# Patient Record
Sex: Female | Born: 1970 | Race: White | Hispanic: No | Smoking: Never smoker
Health system: Southern US, Community
[De-identification: ages and names within clinical notes are randomized; demographics above are authoritative.]

## PROBLEM LIST (undated history)

## (undated) DIAGNOSIS — T753XXA Motion sickness, initial encounter: Secondary | ICD-10-CM

## (undated) HISTORY — PX: WISDOM TOOTH EXTRACTION: SHX21

## (undated) HISTORY — PX: TONSILLECTOMY: SUR1361

## (undated) HISTORY — PX: HERNIA REPAIR: SHX51

## (undated) HISTORY — PX: ANKLE SURGERY: SHX546

---

## 2006-02-03 ENCOUNTER — Ambulatory Visit: Payer: Self-pay

## 2006-02-10 ENCOUNTER — Ambulatory Visit: Payer: Self-pay

## 2007-02-06 ENCOUNTER — Ambulatory Visit: Payer: Self-pay

## 2008-02-06 ENCOUNTER — Ambulatory Visit: Payer: Self-pay

## 2008-11-03 ENCOUNTER — Inpatient Hospital Stay: Payer: Self-pay | Admitting: Specialist

## 2014-03-26 ENCOUNTER — Ambulatory Visit: Payer: Self-pay | Admitting: Obstetrics and Gynecology

## 2014-03-26 LAB — CBC
HCT: 37.3 % (ref 35.0–47.0)
HGB: 11.7 g/dL — AB (ref 12.0–16.0)
MCH: 24 pg — ABNORMAL LOW (ref 26.0–34.0)
MCHC: 31.4 g/dL — AB (ref 32.0–36.0)
MCV: 77 fL — ABNORMAL LOW (ref 80–100)
Platelet: 237 10*3/uL (ref 150–440)
RBC: 4.87 10*6/uL (ref 3.80–5.20)
RDW: 16.5 % — ABNORMAL HIGH (ref 11.5–14.5)
WBC: 9.1 10*3/uL (ref 3.6–11.0)

## 2014-04-02 ENCOUNTER — Ambulatory Visit: Payer: Self-pay | Admitting: Obstetrics and Gynecology

## 2014-10-05 NOTE — Op Note (Signed)
PATIENT NAME:  Miranda Richardson, [name] MR#:  [id number] DATE OF BIRTH:  Nov 10, 1970  DATE OF PROCEDURE:  04/02/2014  PREOPERATIVE DIAGNOSIS:  Menorrhagia and severe dysmenorrhea.   POSTOPERATIVE DIAGNOSES: Menorrhagia and severe dysmenorrhea including endometrial polyp.   OPERATION PERFORMED: Hysteroscopy, dilatation and curettage, and NovaSure endometrial ablation.   ANESTHESIA:  General.   PRIMARY SURGEON: Lorrene ReidAndreas M Kaelani Kendrick, MD   PREOPERATIVE ANTIBIOTICS: None.   DRAINS OR TUBES: None.   IMPLANTS: None.   ESTIMATED BLOOD LOSS: Minimal.   OPERATIVE FLUIDS: 600 mL.   URINE OUTPUT: 150 mL.   COMPLICATIONS: None.   FINDINGS: Normal cervix. The uterine cavity was normal in shape, normal bilateral tubal ostia, small endometrial polyp removed pre-ablation via sharp curettage. Uterine sounding length was 11 cm, cervical length was 5.5 cm for a cavity length of 5.5 cm, cavity width was 4.7 cm. The burn time on the ablation was 1 minute, 19 seconds.   SPECIMENS REMOVED: Endometrial curettings.   THE PATIENT CONDITION FOLLOWING PROCEDURE: Stable.   PROCEDURE IN DETAIL: Risks, benefits, and alternatives of the procedure were discussed with the patient prior to proceeding to the operating room. The patient was taken to the operating room where she was placed under general endotracheal anesthesia. She was positioned in the dorsal lithotomy position utilizing Allen stirrups, prepped and draped in the usual sterile fashion. A timeout was performed. Attention was then turned to the patient's pelvis. The bladder was straight catheterized with a red rubber catheter, sterile speculum was placed. The anterior lip of the cervix was visualized, grasped with a single-tooth tenaculum and cervix was sequentially dilated using Pratt dilators. Hysteroscopy was then performed noting the above findings. The uterine sounding length prior to hysteroscopy was noted to be 11 cm, cervical length was actually obtained by  measuring using the hysteroscope and was noted to be 5.5 cm at the level of the internal cervical os. After the initial hysteroscopy, a sharp curettage was performed to remove the anterior endometrial polyp.  A second look hysteroscopy revealed the polyp was successfully removed.   At this point, the NovaSure device was introduced into the uterine cavity, and the device was deployed for a cavity width of 4.7 cm. The device passed cavity assessment and was activated for a burn time of 1 minute and 19 seconds. Following this, the NovaSure device was removed and a hysteroscopy revealed good coverage of the entire cavity with sparing of the endocervix. The single-tooth tenaculum was removed as was the operative speculum. Sponge, needle and instrument counts were correct x2. The patient tolerated the procedure well and was taken to the recovery room in stable condition.    ____________________________ Florina OuAndreas M. Bonney AidStaebler, MD ams:jp D: 04/05/2014 12:43:18 ET T: 04/05/2014 15:07:57 ET JOB#: [id number]  cc: Florina OuAndreas M. Bonney AidStaebler, MD, <Dictator> Carmel SacramentoANDREAS Cathrine MusterM Janalyn Higby MD ELECTRONICALLY SIGNED 04/11/2014 13:32

## 2014-10-07 LAB — SURGICAL PATHOLOGY

## 2015-12-25 ENCOUNTER — Other Ambulatory Visit: Payer: Self-pay | Admitting: Family Medicine

## 2015-12-25 DIAGNOSIS — Z1231 Encounter for screening mammogram for malignant neoplasm of breast: Secondary | ICD-10-CM

## 2016-01-08 ENCOUNTER — Other Ambulatory Visit: Payer: Self-pay | Admitting: Family Medicine

## 2016-01-08 ENCOUNTER — Ambulatory Visit
Admission: RE | Admit: 2016-01-08 | Discharge: 2016-01-08 | Disposition: A | Discharge status: Home / Self Care | Payer: BLUE CROSS/BLUE SHIELD | Source: Ambulatory Visit | Attending: Family Medicine | Admitting: Family Medicine

## 2016-01-08 DIAGNOSIS — Z1231 Encounter for screening mammogram for malignant neoplasm of breast: Secondary | ICD-10-CM | POA: Diagnosis not present

## 2017-06-29 ENCOUNTER — Other Ambulatory Visit: Payer: Self-pay | Admitting: Family Medicine

## 2017-06-29 DIAGNOSIS — Z01419 Encounter for gynecological examination (general) (routine) without abnormal findings: Secondary | ICD-10-CM

## 2017-06-29 DIAGNOSIS — Z1231 Encounter for screening mammogram for malignant neoplasm of breast: Secondary | ICD-10-CM

## 2017-08-10 ENCOUNTER — Ambulatory Visit
Admission: RE | Admit: 2017-08-10 | Discharge: 2017-08-10 | Disposition: A | Discharge status: Home / Self Care | Payer: BLUE CROSS/BLUE SHIELD | Source: Ambulatory Visit | Attending: Family Medicine | Admitting: Family Medicine

## 2017-08-10 ENCOUNTER — Other Ambulatory Visit: Payer: Self-pay | Admitting: Family Medicine

## 2017-08-10 DIAGNOSIS — Z01419 Encounter for gynecological examination (general) (routine) without abnormal findings: Secondary | ICD-10-CM

## 2017-08-10 DIAGNOSIS — Z1231 Encounter for screening mammogram for malignant neoplasm of breast: Secondary | ICD-10-CM | POA: Diagnosis present

## 2019-09-26 ENCOUNTER — Other Ambulatory Visit: Payer: Self-pay | Admitting: Family Medicine

## 2019-09-26 DIAGNOSIS — Z1231 Encounter for screening mammogram for malignant neoplasm of breast: Secondary | ICD-10-CM

## 2020-01-08 ENCOUNTER — Ambulatory Visit
Admission: RE | Admit: 2020-01-08 | Discharge: 2020-01-08 | Disposition: A | Discharge status: Home / Self Care | Payer: BC Managed Care – PPO | Source: Ambulatory Visit | Attending: Family Medicine | Admitting: Family Medicine

## 2020-01-08 ENCOUNTER — Other Ambulatory Visit: Payer: Self-pay

## 2020-01-08 DIAGNOSIS — Z1231 Encounter for screening mammogram for malignant neoplasm of breast: Secondary | ICD-10-CM | POA: Insufficient documentation

## 2020-11-25 IMAGING — MG DIGITAL SCREENING BILAT W/ TOMO W/ CAD
8 series · 8 of 24 positions shown · non-contrast
Comparison: Previous exam(s).

CLINICAL DATA: Screening.

EXAM:
DIGITAL SCREENING BILATERAL MAMMOGRAM WITH TOMO AND CAD

[R MLO synth-2D]
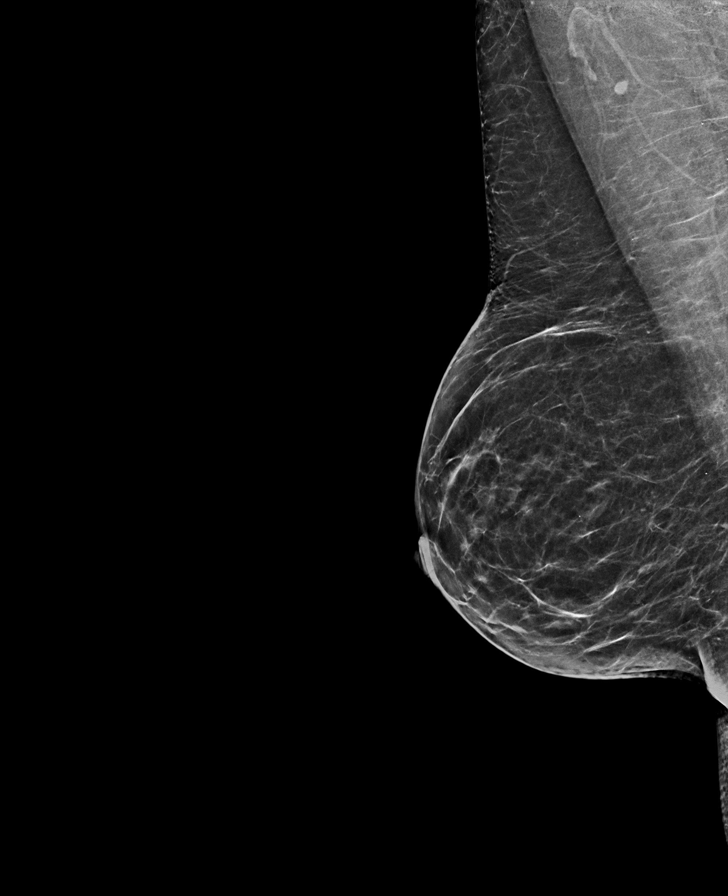

[L MLO synth-2D]
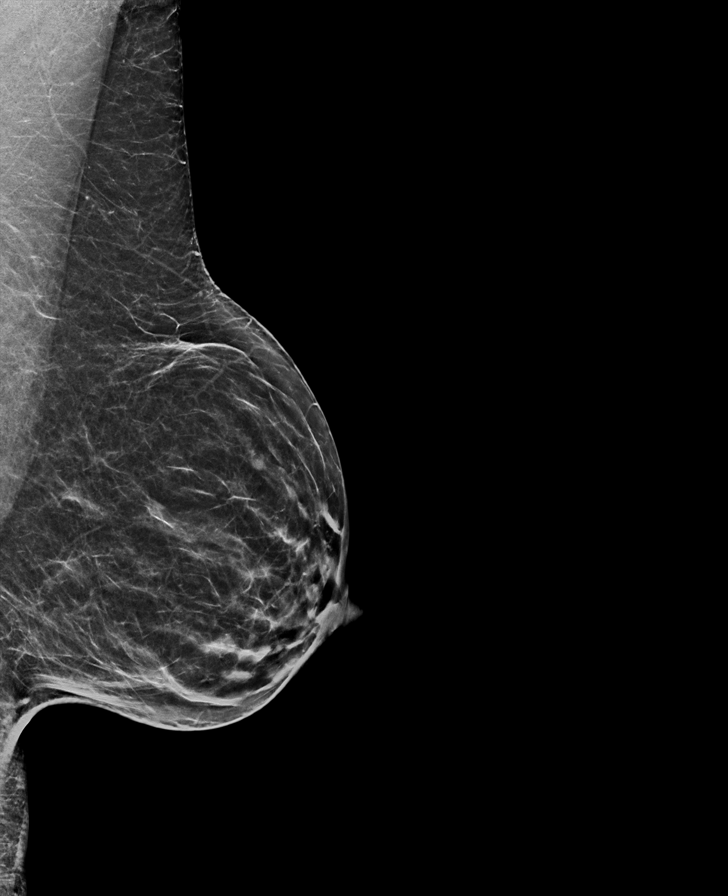

[L CC synth-2D]
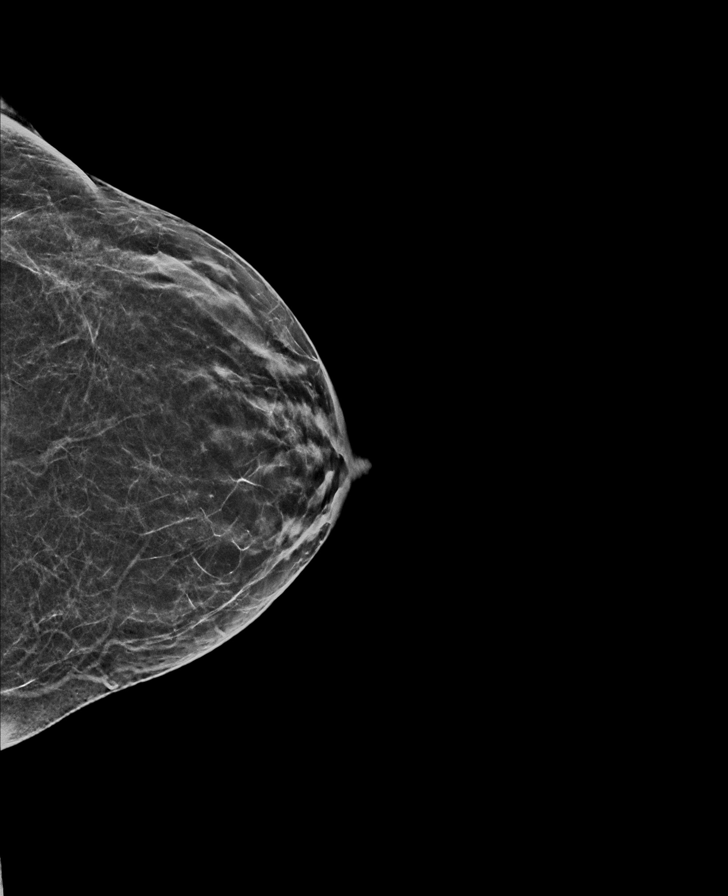

[R CC synth-2D]
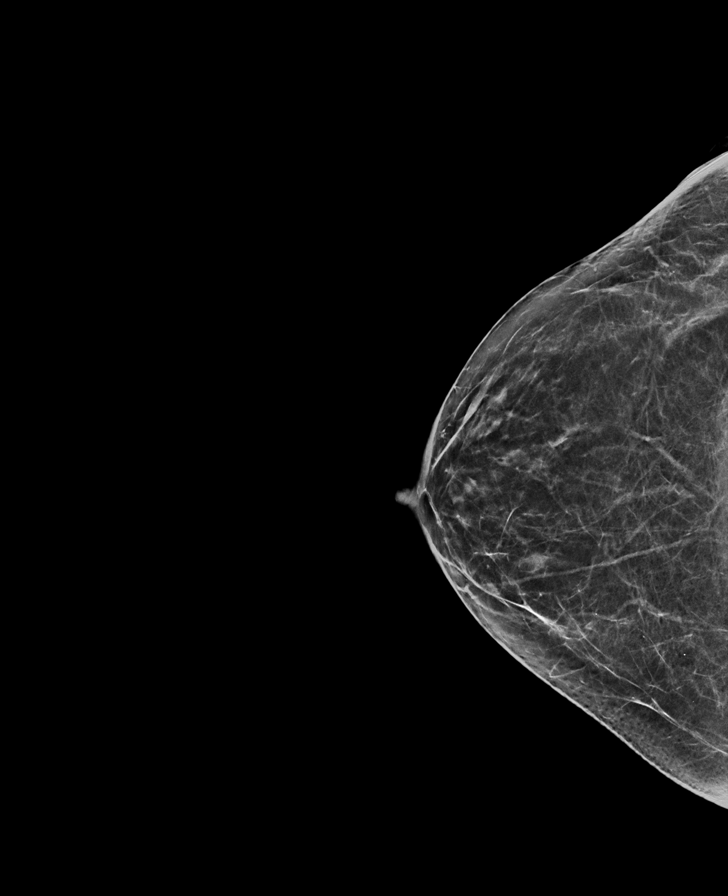

[L MLO tomo · tomo slice 31/60.0]
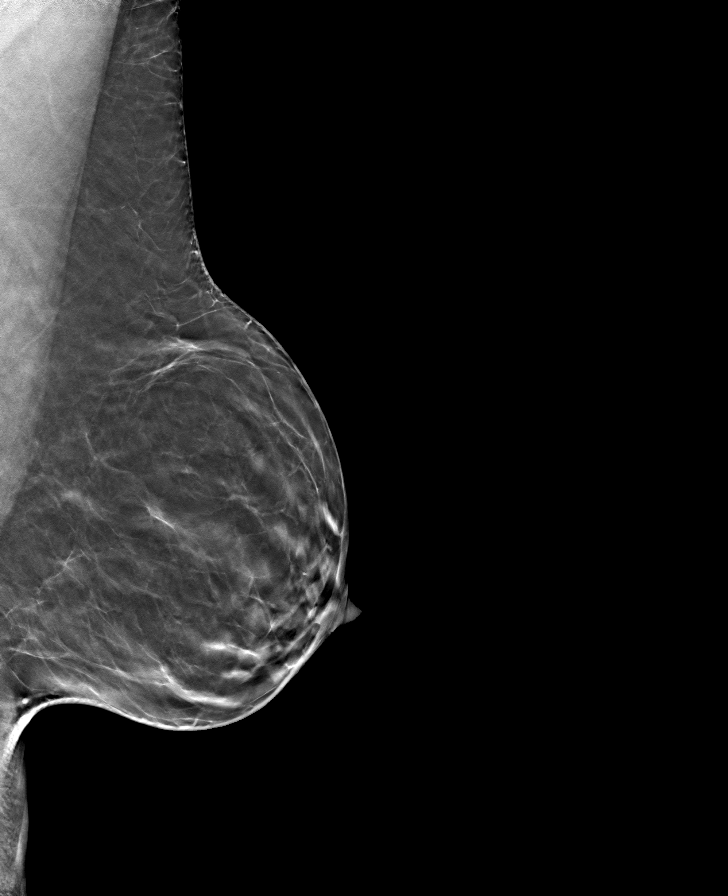

[L CC tomo · tomo slice 27/53.0]
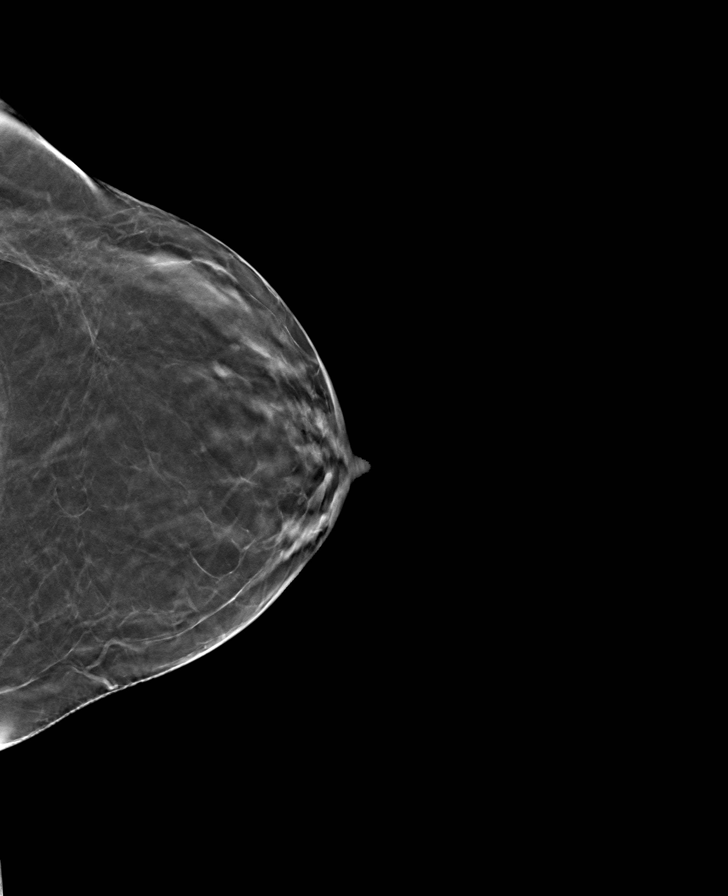

[R MLO tomo · tomo slice 31/61.0]
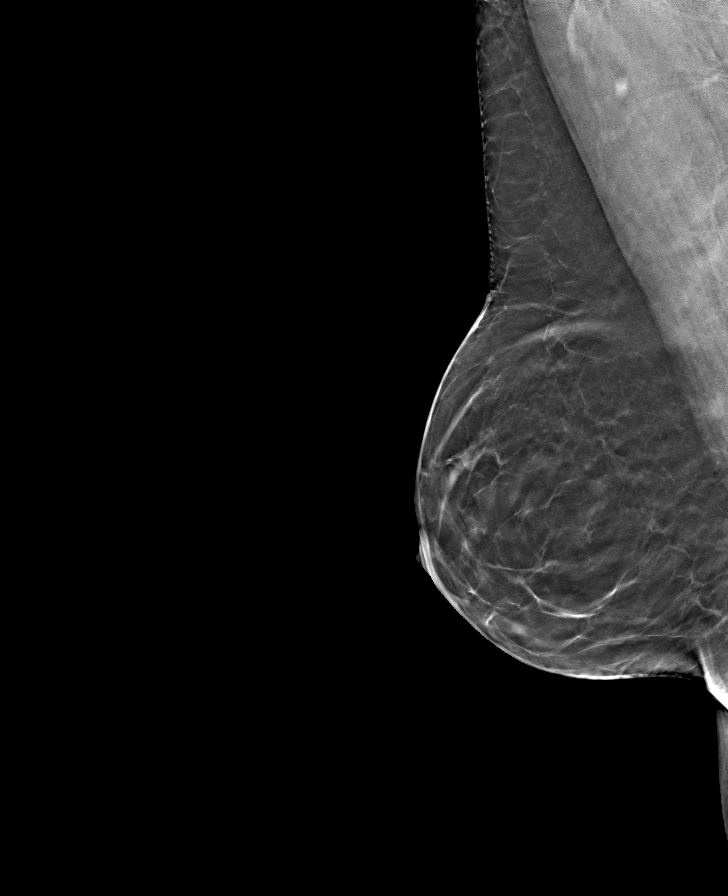

[R CC tomo · tomo slice 29/58.0]
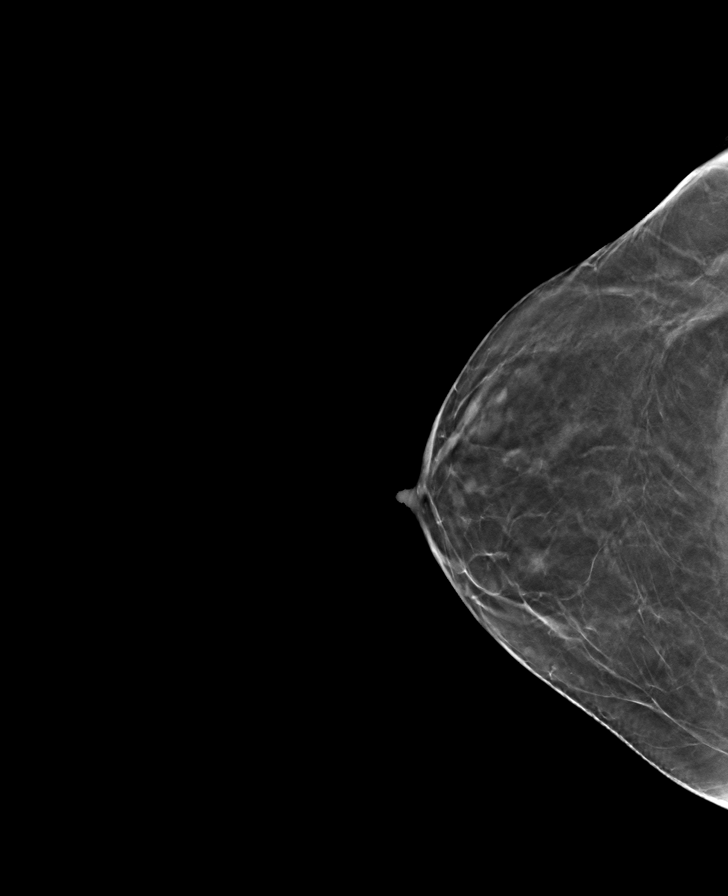

[8 of 24 positions shown; findings below may reference images not displayed]

ACR Breast Density Category b: There are scattered areas of
fibroglandular density.
FINDINGS: There are no findings suspicious for malignancy. Images were
processed with CAD.
IMPRESSION: No mammographic evidence of malignancy. A result letter of this
screening mammogram will be mailed directly to the patient.

RECOMMENDATION:
Screening mammogram in one year. (Code:[id number])

BI-RADS CATEGORY  1: Negative.

## 2021-03-11 ENCOUNTER — Other Ambulatory Visit: Payer: Self-pay | Admitting: Family Medicine

## 2021-03-11 DIAGNOSIS — Z1231 Encounter for screening mammogram for malignant neoplasm of breast: Secondary | ICD-10-CM

## 2021-03-25 ENCOUNTER — Ambulatory Visit
Admission: RE | Admit: 2021-03-25 | Discharge: 2021-03-25 | Disposition: A | Discharge status: Home / Self Care | Payer: 59 | Source: Ambulatory Visit | Attending: Family Medicine | Admitting: Family Medicine

## 2021-03-25 ENCOUNTER — Other Ambulatory Visit: Payer: Self-pay

## 2021-03-25 DIAGNOSIS — Z1231 Encounter for screening mammogram for malignant neoplasm of breast: Secondary | ICD-10-CM | POA: Diagnosis not present

## 2021-08-28 ENCOUNTER — Telehealth: Payer: Self-pay

## 2021-08-28 NOTE — Telephone Encounter (Signed)
CALLED PATIENT NO ANSWER LEFT VOICEMAIL FOR A CALL BACK ? ?

## 2021-09-01 ENCOUNTER — Other Ambulatory Visit: Payer: Self-pay

## 2021-09-01 DIAGNOSIS — Z1211 Encounter for screening for malignant neoplasm of colon: Secondary | ICD-10-CM

## 2021-09-01 MED ORDER — NA SULFATE-K SULFATE-MG SULF 17.5-3.13-1.6 GM/177ML PO SOLN: 1.0000 | Freq: Once | ORAL | 0 refills | Status: AC

## 2021-09-01 NOTE — Progress Notes (Signed)
Gastroenterology Pre-Procedure Review ? ?Request Date: 04/19/2022 ?Requesting Physician: Dr. Servando Snare ? ?PATIENT REVIEW QUESTIONS: The patient responded to the following health history questions as indicated:   ? ?1. Are you having any GI issues? no ?2. Do you have a personal history of Polyps? no ?3. Do you have a family history of Colon Cancer or Polyps? no ?4. Diabetes Mellitus? no ?5. Joint replacements in the past 12 months?no ?6. Major health problems in the past 3 months?no ?7. Any artificial heart valves, MVP, or defibrillator?no ?   ?MEDICATIONS & ALLERGIES:    ?Patient reports the following regarding taking any anticoagulation/antiplatelet therapy:   ?Plavix, Coumadin, Eliquis, Xarelto, Lovenox, Pradaxa, Brilinta, or Effient? no ?Aspirin? no ? ?Patient confirms/reports the following medications:  ?Current Outpatient Medications  ?Medication Sig Dispense Refill  ? atorvastatin (LIPITOR) 20 MG tablet Take 20 mg by mouth daily.    ? ?No current facility-administered medications for this visit.  ? ? ?Patient confirms/reports the following allergies:  ?No Known Allergies ? ?No orders of the defined types were placed in this encounter. ? ? ?AUTHORIZATION INFORMATION ?Primary Insurance: ?1D#: ?Group #: ? ?Secondary Insurance: ?1D#: ?Group #: ? ?SCHEDULE INFORMATION: ?Date: 04/19/2022 ?Time: ?Location:[facility] ? ?

## 2022-04-13 ENCOUNTER — Encounter: Payer: Self-pay | Admitting: Gastroenterology

## 2022-04-14 ENCOUNTER — Other Ambulatory Visit: Payer: Self-pay

## 2022-04-14 MED ORDER — SUTAB 1479-225-188 MG PO TABS: 1.0000 | ORAL_TABLET | Freq: Once | ORAL | 0 refills | Status: AC

## 2022-04-14 NOTE — Progress Notes (Signed)
Patient lvm requesting to have the "Pill" prep for her colonoscopy due to her strong gag reflex.  She stated that she did not need a call back and to just send the prescription over.  I will send her a mychart message to let her know that Nila Nephew has been sent to pharmacy for her, and give instructions.  Thanks,  Ruth, [place]

## 2022-04-19 ENCOUNTER — Ambulatory Visit
Admission: RE | Admit: 2022-04-19 | Discharge: 2022-04-19 | Disposition: A | Discharge status: Home / Self Care | Payer: Managed Care, Other (non HMO) | Source: Ambulatory Visit | Attending: Gastroenterology | Admitting: Gastroenterology

## 2022-04-19 ENCOUNTER — Ambulatory Visit: Payer: Managed Care, Other (non HMO) | Admitting: Anesthesiology

## 2022-04-19 ENCOUNTER — Other Ambulatory Visit: Payer: Self-pay

## 2022-04-19 ENCOUNTER — Encounter: Payer: Self-pay | Admitting: Gastroenterology

## 2022-04-19 ENCOUNTER — Encounter
Admission: RE | Disposition: A | Discharge status: Home / Self Care | Payer: Self-pay | Source: Ambulatory Visit | Attending: Gastroenterology

## 2022-04-19 DIAGNOSIS — K640 First degree hemorrhoids: Secondary | ICD-10-CM | POA: Diagnosis not present

## 2022-04-19 DIAGNOSIS — Z1211 Encounter for screening for malignant neoplasm of colon: Secondary | ICD-10-CM | POA: Insufficient documentation

## 2022-04-19 HISTORY — DX: Motion sickness, initial encounter: T75.3XXA

## 2022-04-19 HISTORY — PX: COLONOSCOPY WITH PROPOFOL: SHX5780

## 2022-04-19 SURGERY — COLONOSCOPY WITH PROPOFOL
Anesthesia: General

## 2022-04-19 MED ORDER — STERILE WATER FOR IRRIGATION IR SOLN
Status: DC | PRN
  Administered 2022-04-19: 1000 mL

## 2022-04-19 MED ORDER — LIDOCAINE HCL (CARDIAC) PF 100 MG/5ML IV SOSY
PREFILLED_SYRINGE | INTRAVENOUS | Status: DC | PRN
  Administered 2022-04-19: 80 mg via INTRAVENOUS

## 2022-04-19 MED ORDER — PROPOFOL 10 MG/ML IV BOLUS
INTRAVENOUS | Status: DC | PRN
  Administered 2022-04-19: 60 mg via INTRAVENOUS
  Administered 2022-04-19 (×2): 20 mg via INTRAVENOUS
  Administered 2022-04-19: 100 mg via INTRAVENOUS
  Administered 2022-04-19: 20 mg via INTRAVENOUS

## 2022-04-19 MED ORDER — SODIUM CHLORIDE 0.9 % IV SOLN: INTRAVENOUS | Status: DC

## 2022-04-19 MED ORDER — LACTATED RINGERS IV SOLN: INTRAVENOUS | Status: DC

## 2022-04-19 SURGICAL SUPPLY — 21 items

## 2022-04-19 NOTE — H&P (Signed)
   Lucilla Lame, MD [address] [place], [place] [address] Phone: [phone] Fax : [phone]  Primary Care Physician:  Sofie Hartigan, MD Primary Gastroenterologist:  Dr. Allen Norris  Pre-Procedure History & Physical: HPI:  Miranda Richardson is a 51 y.o. female is here for a screening colonoscopy.   Past Medical History:  Diagnosis Date   Motion sickness     Past Surgical History:  Procedure Laterality Date   ANKLE SURGERY Right    CESAREAN SECTION     HERNIA REPAIR     age 38   TONSILLECTOMY     WISDOM TOOTH EXTRACTION      Prior to Admission medications   Medication Sig Start Date End Date Taking? Authorizing Provider  atorvastatin (LIPITOR) 20 MG tablet Take 20 mg by mouth daily. 06/23/21  Yes [provider]  Multiple Vitamin (MULTIVITAMIN) tablet Take 1 tablet by mouth daily.   Yes [provider]    Allergies as of 09/01/2021   (No Known Allergies)    Family History  Problem Relation Age of Onset   Breast cancer Maternal Grandmother     Social History   Socioeconomic History   Marital status: Divorced    Spouse name: Not on file   Number of children: Not on file   Years of education: Not on file   Highest education level: Not on file  Occupational History   Not on file  Tobacco Use   Smoking status: Never   Smokeless tobacco: Never  Vaping Use   Vaping Use: Never used  Substance and Sexual Activity   Alcohol use: Yes    Comment: socially   Drug use: Not on file   Sexual activity: Not on file  Other Topics Concern   Not on file  Social History Narrative   Not on file   Social Determinants of Health   Financial Resource Strain: Not on file  Food Insecurity: Not on file  Transportation Needs: Not on file  Physical Activity: Not on file  Stress: Not on file  Social Connections: Not on file  Intimate Partner Violence: Not on file    Review of Systems: See HPI, otherwise negative ROS  Physical Exam: Ht  5\' 6"  (1.676 m)   Wt 81.6 kg   LMP 02/26/2022 (Approximate)   BMI 29.05 kg/m  General:   Alert,  pleasant and cooperative in NAD Head:  Normocephalic and atraumatic. Neck:  Supple; no masses or thyromegaly. Lungs:  Clear throughout to auscultation.    Heart:  Regular rate and rhythm. Abdomen:  Soft, nontender and nondistended. Normal bowel sounds, without guarding, and without rebound.   Neurologic:  Alert and  oriented x4;  grossly normal neurologically.  Impression/Plan: Miranda Richardson is now here to undergo a screening colonoscopy.  Risks, benefits, and alternatives regarding colonoscopy have been reviewed with the patient.  Questions have been answered.  All parties agreeable.

## 2022-04-19 NOTE — Transfer of Care (Signed)
Immediate Anesthesia Transfer of Care Note  Patient: Miranda Richardson  Procedure(s) Performed: COLONOSCOPY WITH PROPOFOL  Patient Location: PACU  Anesthesia Type: General  Level of Consciousness: awake, alert  and patient cooperative  Airway and Oxygen Therapy: Patient Spontanous Breathing and Patient connected to supplemental oxygen  Post-op Assessment: Post-op Vital signs reviewed, Patient's Cardiovascular Status Stable, Respiratory Function Stable, Patent Airway and No signs of Nausea or vomiting  Post-op Vital Signs: Reviewed and stable  Complications: No notable events documented.

## 2022-04-19 NOTE — Anesthesia Postprocedure Evaluation (Signed)
Anesthesia Post Note  Patient: Miranda Richardson  Procedure(s) Performed: COLONOSCOPY WITH PROPOFOL  Patient location during evaluation: PACU Anesthesia Type: General Level of consciousness: awake and alert Pain management: pain level controlled Vital Signs Assessment: post-procedure vital signs reviewed and stable Respiratory status: spontaneous breathing, nonlabored ventilation, respiratory function stable and patient connected to nasal cannula oxygen Cardiovascular status: blood pressure returned to baseline and stable Postop Assessment: no apparent nausea or vomiting Anesthetic complications: no  No notable events documented.   Last Vitals:  Vitals:   04/19/22 1024 04/19/22 1030  BP: 96/66 105/71  Pulse: 77 77  Resp: 19 19  Temp: (!) 36.3 C (!) 36.3 C  SpO2: 97% 98%    Last Pain:  Vitals:   04/19/22 1030  TempSrc:   PainSc: 0-No pain                 Dimas Millin

## 2022-04-19 NOTE — Anesthesia Preprocedure Evaluation (Signed)
Anesthesia Evaluation  Patient identified by MRN, date of birth, ID band Patient awake    Reviewed: Allergy & Precautions, NPO status , Patient's Chart, lab work & pertinent test results  History of Anesthesia Complications Negative for: history of anesthetic complications  Airway Mallampati: III  TM Distance: >3 FB Neck ROM: full    Dental  (+) Chipped   Pulmonary neg pulmonary ROS, neg COPD   Pulmonary exam normal        Cardiovascular (-) angina (-) Past MI and (-) CABG negative cardio ROS Normal cardiovascular exam     Neuro/Psych negative neurological ROS  negative psych ROS   GI/Hepatic negative GI ROS, Neg liver ROS,,,  Endo/Other  negative endocrine ROS    Renal/GU negative Renal ROS  negative genitourinary   Musculoskeletal   Abdominal   Peds  Hematology negative hematology ROS (+)   Anesthesia Other Findings Past Medical History: No date: Motion sickness  Past Surgical History: No date: ANKLE SURGERY; Right No date: CESAREAN SECTION No date: HERNIA REPAIR     Comment:  age 51 No date: TONSILLECTOMY No date: WISDOM TOOTH EXTRACTION  BMI    Body Mass Index: 27.28 kg/m      Reproductive/Obstetrics negative OB ROS                             Anesthesia Physical Anesthesia Plan  ASA: 2  Anesthesia Plan: General   Post-op Pain Management: Minimal or no pain anticipated   Induction: Intravenous  PONV Risk Score and Plan: 3 and Propofol infusion, TIVA and Ondansetron  Airway Management Planned: Nasal Cannula  Additional Equipment: None  Intra-op Plan:   Post-operative Plan:   Informed Consent: I have reviewed the patients History and Physical, chart, labs and discussed the procedure including the risks, benefits and alternatives for the proposed anesthesia with the patient or authorized representative who has indicated his/her understanding and acceptance.      Dental advisory given  Plan Discussed with: CRNA and Surgeon  Anesthesia Plan Comments: (Discussed risks of anesthesia with patient, including possibility of difficulty with spontaneous ventilation under anesthesia necessitating airway intervention, PONV, and rare risks such as cardiac or respiratory or neurological events, and allergic reactions. Discussed the role of CRNA in patient's perioperative care. Patient understands.)       Anesthesia Quick Evaluation

## 2022-04-19 NOTE — Op Note (Signed)
[facility] Gastroenterology Patient Name: Miranda Richardson Procedure Date: 04/19/2022 9:58 AM MRN: [id number] Account #: [id number] Date of Birth: 1970/06/19 Admit Type: Outpatient Age: 51 Room: [facility] OR ROOM 01 Gender: Female Note Status: Finalized Instrument Name: [id number] Procedure:             Colonoscopy Indications:           Screening for colorectal malignant neoplasm Providers:             Midge Minium MD, MD Referring MD:          Marina Goodell (Referring MD) Medicines:             Propofol per Anesthesia Complications:         No immediate complications. Procedure:             Pre-Anesthesia Assessment:                        - Prior to the procedure, a History and Physical was                         performed, and patient medications and allergies were                         reviewed. The patient's tolerance of previous                         anesthesia was also reviewed. The risks and benefits                         of the procedure and the sedation options and risks                         were discussed with the patient. All questions were                         answered, and informed consent was obtained. Prior                         Anticoagulants: The patient has taken no anticoagulant                         or antiplatelet agents. ASA Grade Assessment: II - A                         patient with mild systemic disease. After reviewing                         the risks and benefits, the patient was deemed in                         satisfactory condition to undergo the procedure.                        After obtaining informed consent, the colonoscope was                         passed under direct vision. Throughout the procedure,  the patient's blood pressure, pulse, and oxygen                         saturations were monitored continuously. The                         Colonoscope was introduced through the anus  and                         advanced to the the cecum, identified by appendiceal                         orifice and ileocecal valve. The colonoscopy was                         performed without difficulty. The patient tolerated                         the procedure well. The quality of the bowel                         preparation was excellent. Findings:      The perianal and digital rectal examinations were normal.      Non-bleeding internal hemorrhoids were found during retroflexion. The       hemorrhoids were Grade I (internal hemorrhoids that do not prolapse). Impression:            - Non-bleeding internal hemorrhoids.                        - No specimens collected. Recommendation:        - Discharge patient to home.                        - Resume previous diet.                        - Continue present medications.                        - Repeat colonoscopy in 10 years for screening                         purposes. Procedure Code(s):     --- Professional ---                        [phone], Colonoscopy, flexible; diagnostic, including                         collection of specimen(s) by brushing or washing, when                         performed (separate procedure) Diagnosis Code(s):     --- Professional ---                        Z12.11, Encounter for screening for malignant neoplasm                         of colon CPT copyright 2022 [employer]. All rights reserved. The  codes documented in this report are preliminary and upon coder review may  be revised to meet current compliance requirements. Lucilla Lame MD, MD 04/19/2022 10:20:18 AM This report has been signed electronically. Number of Addenda: 0 Note Initiated On: 04/19/2022 9:58 AM Scope Withdrawal Time: 0 hours 6 minutes 17 seconds  Total Procedure Duration: 0 hours 9 minutes 55 seconds  Estimated Blood Loss:  Estimated blood loss: none.      [facility]

## 2022-04-20 ENCOUNTER — Encounter: Payer: Self-pay | Admitting: Gastroenterology

## 2022-09-02 ENCOUNTER — Other Ambulatory Visit: Payer: Self-pay | Admitting: Family Medicine

## 2022-09-02 DIAGNOSIS — Z1231 Encounter for screening mammogram for malignant neoplasm of breast: Secondary | ICD-10-CM

## 2022-09-14 ENCOUNTER — Ambulatory Visit
Admission: RE | Admit: 2022-09-14 | Discharge: 2022-09-14 | Disposition: A | Discharge status: Home / Self Care | Payer: BC Managed Care – PPO | Source: Ambulatory Visit | Attending: Family Medicine | Admitting: Family Medicine

## 2022-09-14 DIAGNOSIS — Z1231 Encounter for screening mammogram for malignant neoplasm of breast: Secondary | ICD-10-CM | POA: Insufficient documentation

## 2023-09-28 ENCOUNTER — Other Ambulatory Visit: Payer: Self-pay | Admitting: Family Medicine

## 2023-09-28 DIAGNOSIS — Z1231 Encounter for screening mammogram for malignant neoplasm of breast: Secondary | ICD-10-CM

## 2023-10-12 ENCOUNTER — Ambulatory Visit
Admission: RE | Admit: 2023-10-12 | Discharge: 2023-10-12 | Disposition: A | Discharge status: Home / Self Care | Source: Ambulatory Visit | Attending: Family Medicine | Admitting: Family Medicine

## 2023-10-12 DIAGNOSIS — Z1231 Encounter for screening mammogram for malignant neoplasm of breast: Secondary | ICD-10-CM | POA: Diagnosis present
